# Patient Record
Sex: Male | Born: 1965 | Race: White | Hispanic: No | Marital: Married | Smoking: Never smoker
Health system: Southern US, Community
[De-identification: ages and names within clinical notes are randomized; demographics above are authoritative.]

---

## 2011-03-04 ENCOUNTER — Emergency Department (HOSPITAL_BASED_OUTPATIENT_CLINIC_OR_DEPARTMENT_OTHER)
Admission: EM | Admit: 2011-03-04 | Discharge: 2011-03-04 | Disposition: A | Discharge status: Home Health | Payer: 59 | Attending: Emergency Medicine | Admitting: Emergency Medicine

## 2011-03-04 ENCOUNTER — Encounter: Payer: Self-pay | Admitting: *Deleted

## 2011-03-04 DIAGNOSIS — S335XXA Sprain of ligaments of lumbar spine, initial encounter: Secondary | ICD-10-CM | POA: Insufficient documentation

## 2011-03-04 DIAGNOSIS — X58XXXA Exposure to other specified factors, initial encounter: Secondary | ICD-10-CM | POA: Insufficient documentation

## 2011-03-04 DIAGNOSIS — S39012A Strain of muscle, fascia and tendon of lower back, initial encounter: Secondary | ICD-10-CM

## 2011-03-04 MED ORDER — HYDROMORPHONE HCL 1 MG/ML IJ SOLN
1.0000 mg | Freq: Once | INTRAMUSCULAR | Status: AC
  Administered 2011-03-04: 1 mg via INTRAMUSCULAR
  Filled 2011-03-04: qty 1

## 2011-03-04 MED ORDER — DIAZEPAM 5 MG PO TABS: 5.0000 mg | ORAL_TABLET | Freq: Four times a day (QID) | ORAL | Status: AC | PRN

## 2011-03-04 MED ORDER — DIAZEPAM 5 MG PO TABS
5.0000 mg | ORAL_TABLET | Freq: Once | ORAL | Status: AC
  Administered 2011-03-04: 5 mg via ORAL
  Filled 2011-03-04: qty 1

## 2011-03-04 MED ORDER — KETOROLAC TROMETHAMINE 30 MG/ML IJ SOLN
30.0000 mg | Freq: Once | INTRAMUSCULAR | Status: AC
  Administered 2011-03-04: 30 mg via INTRAMUSCULAR
  Filled 2011-03-04: qty 1

## 2011-03-04 MED ORDER — OXYCODONE-ACETAMINOPHEN 5-325 MG PO TABS: 1.0000 | ORAL_TABLET | ORAL | Status: AC | PRN

## 2011-03-04 MED ORDER — IBUPROFEN 600 MG PO TABS: 600.0000 mg | ORAL_TABLET | Freq: Four times a day (QID) | ORAL | Status: AC | PRN

## 2011-03-04 NOTE — ED Provider Notes (Signed)
History     Chief Complaint  Patient presents with  . Back Pain   Patient is a 45 y.o. male presenting with back pain. The history is provided by the patient.  Back Pain  The current episode started yesterday. The problem occurs constantly. The problem has been gradually worsening. The pain is present in the lumbar spine. The quality of the pain is described as stabbing. The pain does not radiate. The pain is moderate. The symptoms are aggravated by bending and certain positions. Pertinent negatives include no fever, no numbness, no abdominal pain, no bowel incontinence, no perianal numbness, no bladder incontinence, no paresthesias, no paresis and no tingling.  patient bent over yesterday and his lower back started to hurt.   History reviewed. No pertinent past medical history.  History reviewed. No pertinent past surgical history.  History reviewed. No pertinent family history.  History  Substance Use Topics  . Smoking status: Never Smoker   . Smokeless tobacco: Not on file  . Alcohol Use: No      Review of Systems  Constitutional: Negative for fever.  Gastrointestinal: Negative for abdominal pain and bowel incontinence.  Genitourinary: Negative for bladder incontinence, urgency and decreased urine volume.  Musculoskeletal: Positive for back pain. Negative for myalgias, joint swelling and gait problem.  Neurological: Negative for tingling, numbness and paresthesias.    Physical Exam  BP 138/87  Pulse 66  Temp(Src) 98.1 F (36.7 C) (Oral)  Resp 20  Ht 5\' 7"  (1.702 m)  Wt 175 lb (79.379 kg)  BMI 27.41 kg/m2  SpO2 99%  Physical Exam  Nursing note and vitals reviewed. Constitutional: He is oriented to person, place, and time. He appears well-developed and well-nourished.  HENT:  Head: Normocephalic and atraumatic.  Abdominal: Soft. He exhibits no distension and no mass. There is no tenderness. There is no rebound and no guarding.  Musculoskeletal: Normal range of  motion. He exhibits no edema.       Lumbar paraspinal spasm. No spinal tenderness. No rash. Strength and sensation intact in bilateral lower extremities.   Neurological: He is alert and oriented to person, place, and time. No cranial nerve deficit.  Skin: Skin is warm and dry.  Psychiatric: He has a normal mood and affect.    ED Course  Procedures  MDM Back pain after bending over. No red flags. Pain now managable will d/c. Follow up as needed.       Juliet Rude. Rubin Payor, MD 03/04/11 [id number]

## 2011-03-04 NOTE — ED Notes (Signed)
Pt states he bent over on Friday and injured his lower back. Has progressively gotten worse.

## 2019-01-11 ENCOUNTER — Emergency Department (HOSPITAL_BASED_OUTPATIENT_CLINIC_OR_DEPARTMENT_OTHER)
Admission: EM | Admit: 2019-01-11 | Discharge: 2019-01-11 | Disposition: A | Discharge status: Home / Self Care | Payer: 59 | Attending: Emergency Medicine | Admitting: Emergency Medicine

## 2019-01-11 ENCOUNTER — Emergency Department (HOSPITAL_BASED_OUTPATIENT_CLINIC_OR_DEPARTMENT_OTHER): Payer: 59

## 2019-01-11 ENCOUNTER — Other Ambulatory Visit: Payer: Self-pay

## 2019-01-11 ENCOUNTER — Encounter (HOSPITAL_BASED_OUTPATIENT_CLINIC_OR_DEPARTMENT_OTHER): Payer: Self-pay | Admitting: Emergency Medicine

## 2019-01-11 DIAGNOSIS — S0990XA Unspecified injury of head, initial encounter: Secondary | ICD-10-CM | POA: Diagnosis present

## 2019-01-11 DIAGNOSIS — S060X9A Concussion with loss of consciousness of unspecified duration, initial encounter: Secondary | ICD-10-CM | POA: Insufficient documentation

## 2019-01-11 DIAGNOSIS — Y93H2 Activity, gardening and landscaping: Secondary | ICD-10-CM | POA: Insufficient documentation

## 2019-01-11 DIAGNOSIS — Y929 Unspecified place or not applicable: Secondary | ICD-10-CM | POA: Diagnosis not present

## 2019-01-11 DIAGNOSIS — Y990 Civilian activity done for income or pay: Secondary | ICD-10-CM | POA: Diagnosis not present

## 2019-01-11 DIAGNOSIS — W312XXA Contact with powered woodworking and forming machines, initial encounter: Secondary | ICD-10-CM | POA: Diagnosis not present

## 2019-01-11 DIAGNOSIS — S20212A Contusion of left front wall of thorax, initial encounter: Secondary | ICD-10-CM | POA: Diagnosis not present

## 2019-01-11 DIAGNOSIS — S0181XA Laceration without foreign body of other part of head, initial encounter: Secondary | ICD-10-CM | POA: Diagnosis not present

## 2019-01-11 DIAGNOSIS — S20219A Contusion of unspecified front wall of thorax, initial encounter: Secondary | ICD-10-CM

## 2019-01-11 MED ORDER — TETANUS-DIPHTH-ACELL PERTUSSIS 5-2.5-18.5 LF-MCG/0.5 IM SUSP: 0.5000 mL | Freq: Once | INTRAMUSCULAR | Status: DC

## 2019-01-11 MED ORDER — LIDOCAINE-EPINEPHRINE (PF) 1 %-1:200000 IJ SOLN
INTRAMUSCULAR | Status: AC
  Administered 2019-01-11: 10 mL
  Filled 2019-01-11: qty 10

## 2019-01-11 NOTE — ED Notes (Signed)
ED Provider at bedside. 

## 2019-01-11 NOTE — ED Triage Notes (Signed)
Pt was working outside with a Investment banker, corporate. He was hit in the head with something. Reports LOC. He is unsure of what happened. He has a large laceration noted to his forehead which is still bleeding. Pressure bandage applied. Also has bruising noted to L upper chest.

## 2019-01-11 NOTE — Discharge Instructions (Signed)
Apply ice to help with the swelling, take over-the-counter medications as needed for pain, suture removal in 5 days

## 2019-01-11 NOTE — ED Notes (Signed)
Pt has a vaccine injured child and is strongly against vaccinations.

## 2019-01-11 NOTE — ED Provider Notes (Signed)
[facility] EMERGENCY DEPARTMENT Provider Note   CSN: [id number] Arrival date & time: 01/11/19  3244    History   Chief Complaint Chief Complaint  Patient presents with  . Head Injury    HPI Julian Bennett is a 53 y.o. male.     HPI Patient presents to the emergency room for evaluation after a head injury.  Patient recalls working outside.  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  Patient is not exactly sure what happened after that.  Coworkers did not actually witness the event think that there must of been some sort of kicked back because a large metal piece of the wood chipper that weighs maybe 20 or 30 pounds was knocked off of the device.  It must have struck the patient in the head and the chest.  Patient denies any neck pain.  He denies any numbness or weakness.  No shortness of breath.  No abdominal pain.  He did not realize he had a bruise on his chest till he undressed when he was here. History reviewed. No pertinent past medical history.  There are no active problems to display for this patient.   History reviewed. No pertinent surgical history.      Home Medications    Prior to Admission medications   Not on File    Family History No family history on file.  Social History Social History   Tobacco Use  . Smoking status: Never Smoker  . Smokeless tobacco: Never Used  Substance Use Topics  . Alcohol use: No  . Drug use: No     Allergies   Patient has no known allergies.   Review of Systems Review of Systems  All other systems reviewed and are negative.    Physical Exam Updated Vital Signs BP (!) 157/110 (BP Location: Right Arm)   Pulse 96   Temp 97.7 F (36.5 C) (Oral)   Resp 18   SpO2 100%   Physical Exam Vitals signs and nursing note reviewed.  Constitutional:      General: He is not in acute distress.    Appearance: He is well-developed.  HENT:     Head: Normocephalic.     Comments: Wedge-shaped  laceration to the left forehead approximately 4 by 3 cm    Right Ear: External ear normal.     Left Ear: External ear normal.  Eyes:     General: No scleral icterus.       Right eye: No discharge.        Left eye: No discharge.     Conjunctiva/sclera: Conjunctivae normal.  Neck:     Musculoskeletal: Neck supple.     Trachea: No tracheal deviation.  Cardiovascular:     Rate and Rhythm: Normal rate and regular rhythm.  Pulmonary:     Effort: Pulmonary effort is normal. No respiratory distress.     Breath sounds: Normal breath sounds. No stridor. No wheezing or rales.     Comments: Contusion noted on the left anterior chest wall Abdominal:     General: Bowel sounds are normal. There is no distension.     Palpations: Abdomen is soft.     Tenderness: There is no abdominal tenderness. There is no guarding or rebound.  Musculoskeletal:        General: No tenderness.  Skin:    General: Skin is warm and dry.     Findings: No rash.  Neurological:     Mental Status: He is  alert.     Cranial Nerves: No cranial nerve deficit (no facial droop, extraocular movements intact, no slurred speech).     Sensory: No sensory deficit.     Motor: No abnormal muscle tone or seizure activity.     Coordination: Coordination normal.     Comments: Patient is alert and oriented but does not recall the details of the event      ED Treatments / Results  Labs (all labs ordered are listed, but only abnormal results are displayed) Labs Reviewed - No data to display  EKG None  Radiology Dg Ribs Unilateral W/chest Left  Result Date: 01/11/2019 CLINICAL DATA:  Hit in chest with solid object EXAM: LEFT RIBS AND CHEST - 3+ VIEW COMPARISON:  None. FINDINGS: Frontal chest as well as oblique and cone-down rib images obtained. Lungs are clear. Heart size and pulmonary vascularity are normal. No adenopathy. There is no pneumothorax or pleural effusion. No rib fracture. IMPRESSION: No evident rib fracture.  Lungs  clear. Electronically Signed   By: Bretta BangWilliam  Woodruff III M.D.   On: 01/11/2019 11:43   Ct Head Wo Contrast  Result Date: 01/11/2019 CLINICAL DATA:  Hit in head with solid object with transient loss of consciousness. EXAM: CT HEAD WITHOUT CONTRAST TECHNIQUE: Contiguous axial images were obtained from the base of the skull through the vertex without intravenous contrast. COMPARISON:  None. FINDINGS: Brain: The ventricles are normal in size and configuration. There is no intracranial mass, hemorrhage, extra-axial fluid collection, or midline shift. The brain parenchyma appears unremarkable. No acute infarct evident. Vascular: No hyperdense vessel. There is no appreciable vascular calcification. Skull: The bony calvarium appears intact. There is soft tissue injury along the left frontal scalp with soft tissue air and mild soft tissue swelling. Sinuses/Orbits: There is mucosal thickening in several ethmoid air cells. Other visualized paranasal sinuses are clear. Orbits appear symmetric bilaterally. Other: Mastoid air cells are clear. IMPRESSION: Left frontal scalp injury.  No fracture. No intracranial mass or hemorrhage. No extra-axial fluid. Brain parenchyma appears normal. Mucosal thickening in several ethmoid air cells. Electronically Signed   By: Bretta BangWilliam  Woodruff III M.D.   On: 01/11/2019 11:42    Procedures .Marland Kitchen.Laceration Repair Date/Time: 01/11/2019 11:01 AM Performed by: Linwood DibblesKnapp, Stacee Earp, MD Authorized by: Linwood DibblesKnapp, Maalle Starrett, MD   Consent:    Consent obtained:  Verbal   Consent given by:  Patient   Risks discussed:  Infection, need for additional repair, pain, poor cosmetic result and poor wound healing   Alternatives discussed:  No treatment and delayed treatment Universal protocol:    Procedure explained and questions answered to patient or proxy's satisfaction: yes     Relevant documents present and verified: yes     Test results available and properly labeled: yes     Imaging studies available: yes      Required blood products, implants, devices, and special equipment available: yes     Site/side marked: yes     Immediately prior to procedure, a time out was called: yes     Patient identity confirmed:  Verbally with patient Anesthesia (see MAR for exact dosages):    Anesthesia method:  Local infiltration   Local anesthetic:  Lidocaine 1% WITH epi Laceration details:    Location: Left forehead.   Length (cm):  7   Depth (mm):  3 Repair type:    Repair type:  Complex Pre-procedure details:    Preparation:  Patient was prepped and draped in usual sterile fashion Exploration:  Limited defect created (wound extended): yes     Hemostasis achieved with:  Direct pressure   Wound exploration: entire depth of wound probed and visualized     Wound extent: no underlying fracture noted and no vascular damage noted     Contaminated: no   Treatment:    Area cleansed with:  Saline   Amount of cleaning:  Extensive   Irrigation solution:  Sterile saline   Irrigation method:  Pressure wash   Visualized foreign bodies/material removed: no     Debridement:  None   Undermining:  None   Scar revision: no   Fascia repair:    Suture size:  3-0   Suture material:  Vicryl   Suture technique:  Simple interrupted   Number of sutures:  2 Skin repair:    Repair method:  Sutures and tissue adhesive   Suture size:  5-0   Suture material:  Prolene   Suture technique:  Simple interrupted   Number of sutures:  7 Approximation:    Approximation:  Close Post-procedure details:    Dressing:  Open (no dressing) Comments:     Small amount of dermabond placed at the angle of the wound to keep the edges well approximated   (including critical care time)  Medications Ordered in ED Medications  Tdap (BOOSTRIX) injection 0.5 mL (0.5 mLs Intramuscular Not Given 01/11/19 1109)  lidocaine-EPINEPHrine (XYLOCAINE-EPINEPHrine) 1 %-1:200000 (PF) injection (10 mLs  Given 01/11/19 1044)  lidocaine-EPINEPHrine  (XYLOCAINE-EPINEPHrine) 1 %-1:200000 (PF) injection (10 mLs  Given 01/11/19 1044)     Initial Impression / Assessment and Plan / ED Course  I have reviewed the triage vital signs and the nursing notes.  Pertinent labs & imaging results that were available during my care of the patient were reviewed by me and considered in my medical decision making (see chart for details).   Head CT without acute findings.  Chest x-ray without fracture No signs of any serious head injury associated with the patient's injury.  I suspect patient does have a mild concussion.  Patient is stable for discharge.  Discussed use of over-the-counter medications.  Suture removal in 5 days.  Patient's wife was present during the discharge process  Final Clinical Impressions(s) / ED Diagnoses   Final diagnoses:  Injury of head, initial encounter  Concussion with loss of consciousness, initial encounter  Laceration of forehead, initial encounter  Contusion of chest wall, unspecified laterality, initial encounter    ED Discharge Orders    None       Linwood DibblesKnapp, Simeon Vera, MD 01/11/19 1226

## 2019-01-17 ENCOUNTER — Other Ambulatory Visit: Payer: Self-pay

## 2019-01-17 ENCOUNTER — Emergency Department (HOSPITAL_BASED_OUTPATIENT_CLINIC_OR_DEPARTMENT_OTHER)
Admission: EM | Admit: 2019-01-17 | Discharge: 2019-01-17 | Disposition: A | Discharge status: Home / Self Care | Payer: 59 | Attending: Emergency Medicine | Admitting: Emergency Medicine

## 2019-01-17 ENCOUNTER — Encounter (HOSPITAL_BASED_OUTPATIENT_CLINIC_OR_DEPARTMENT_OTHER): Payer: Self-pay | Admitting: *Deleted

## 2019-01-17 DIAGNOSIS — Z4802 Encounter for removal of sutures: Secondary | ICD-10-CM | POA: Diagnosis not present

## 2019-01-17 DIAGNOSIS — X58XXXD Exposure to other specified factors, subsequent encounter: Secondary | ICD-10-CM | POA: Insufficient documentation

## 2019-01-17 DIAGNOSIS — S20212D Contusion of left front wall of thorax, subsequent encounter: Secondary | ICD-10-CM | POA: Insufficient documentation

## 2019-01-17 NOTE — ED Triage Notes (Signed)
Here for suture removal from his forehead. Sutures intact and incision looks well healed. He states his left chest is badly bruised with a knot felt inside the area of injury.

## 2019-01-17 NOTE — ED Provider Notes (Signed)
[facility] Provider Note   CSN: [id number] Arrival date & time: 01/17/19  1505    History   Chief Complaint Chief Complaint  Patient presents with  . Suture / Staple Removal    HPI Lula Michaux is a 53 y.o. male.     The history is provided by the patient and medical records. No language interpreter was used.  Suture / Staple Removal Pertinent negatives include no chest pain and no shortness of breath.   Joanathan Affeldt is a 53 y.o. male who presents to the Emergency Department for suture removal from the forehead.  Feels as if they are healing well.  No redness or drainage from his wounds.   Additionally reports swelling and bruising to left chest wall.  It is not painful, but he would like this to be looked at as well while he is in the emergency department today.  He is not on any anticoagulation.  He is not having any shortness of breath.  History reviewed. No pertinent past medical history.  There are no active problems to display for this patient.   History reviewed. No pertinent surgical history.      Home Medications    Prior to Admission medications   Not on File    Family History No family history on file.  Social History Social History   Tobacco Use  . Smoking status: Never Smoker  . Smokeless tobacco: Never Used  Substance Use Topics  . Alcohol use: No  . Drug use: No     Allergies   Patient has no known allergies.   Review of Systems Review of Systems  Constitutional: Negative for fever.  Respiratory: Negative for shortness of breath.   Cardiovascular: Negative for chest pain.  Musculoskeletal: Negative for arthralgias and myalgias.  Skin: Positive for wound.     Physical Exam Updated Vital Signs BP (!) 163/103   Pulse 74   Temp 98.3 F (36.8 C) (Oral)   Resp 20   Ht 5\' 7"  (1.702 m)   Wt 79.4 kg   SpO2 99%   BMI 27.42 kg/m   Physical Exam Vitals signs and nursing note reviewed.   Constitutional:      General: He is not in acute distress.    Appearance: He is well-developed.  HENT:     Head: Normocephalic.     Comments: Well-healing laceration to the forehead which is c/d/i with sutures in place. Neck:     Musculoskeletal: Neck supple.  Cardiovascular:     Rate and Rhythm: Normal rate and regular rhythm.     Heart sounds: Normal heart sounds. No murmur.  Pulmonary:     Effort: Pulmonary effort is normal. No respiratory distress.     Breath sounds: Normal breath sounds. No wheezing or rales.     Comments: Lungs clear to auscultation bilaterally. Musculoskeletal: Normal range of motion.     Comments: Ecchymosis to the left upper chest wall.  Nontender.  One 2 x 2 centimeter area of swelling within the ecchymosis which is also nontender.  Likely hematoma.  Skin:    General: Skin is warm and dry.  Neurological:     Mental Status: He is alert.      ED Treatments / Results  Labs (all labs ordered are listed, but only abnormal results are displayed) Labs Reviewed - No data to display  EKG None  Radiology No results found.  Procedures .Suture Removal  Date/Time: 01/17/2019 3:16 PM Performed by: Marquise Wicke, York Cerise  Pilcher, PA-C Authorized by: Mana Haberl, Chase PicketJaime Pilcher, PA-C   Consent:    Consent obtained:  Verbal   Consent given by:  Patient   Risks discussed:  Bleeding, pain and wound separation Procedure details:    Wound appearance:  No signs of infection and clean   Number of sutures removed:  7 Post-procedure details:    Patient tolerance of procedure:  Tolerated well, no immediate complications   (including critical care time)  Medications Ordered in ED Medications - No data to display   Initial Impression / Assessment and Plan / ED Course  I have reviewed the triage vital signs and the nursing notes.  Pertinent labs & imaging results that were available during my care of the patient were reviewed by me and considered in my medical decision making  (see chart for details).       Derrick Ravelllen Pautler is a 53 y.o. male who presents to ED for 2 complaints:  1.  Suture removal.  Wound appears clean without any signs of infection.  Sutures removed without complication.  Continued wound care instructions discussed.   2.  Ecchymosis to chest wall.  Per chart review, contusion was noted on initial evaluation after injury.  Patient reports change in coloration of bruising as well as " knot" to the area.  On my exam, area is nontender.  He is not having any difficulty breathing and has clear lung sounds.  Nodule of concern likely represents hematoma.  Warm compresses and home care instructions discussed.  Likely will resolve.  Reasons to return to the emergency department were discussed.  PCP follow-up if no improvement in a few days.  All questions answered.   Final Clinical Impressions(s) / ED Diagnoses   Final diagnoses:  Visit for suture removal    ED Discharge Orders    None       Contrina Orona, Chase PicketJaime Pilcher, PA-C 01/17/19 1707    Jacalyn LefevreHaviland, Julie, MD 01/17/19 1718

## 2019-01-17 NOTE — Discharge Instructions (Addendum)
Warm compresses to the affected area on chest.   Keep wound clean and dry.   Follow up with your primary care doctor.   Return to ER for new or worsening symptoms, any additional concerns.

## 2019-01-17 NOTE — ED Notes (Signed)
Pt. Had bruising in the L chest area / purple and yellow in color with some hardness noted under the bruising.  Pt. Reports just soreness.  Pt. Has no shortness of breath and no limited mobility to the L arm or L shoulder.  Pt. In no distress.

## 2019-12-09 IMAGING — CR LEFT RIBS AND CHEST - 3+ VIEW
4 series · 4 of 4 positions shown · non-contrast
Comparison: None.

CLINICAL DATA: Hit in chest with solid object

EXAM:
LEFT RIBS AND CHEST - 3+ VIEW

[w chest pa]
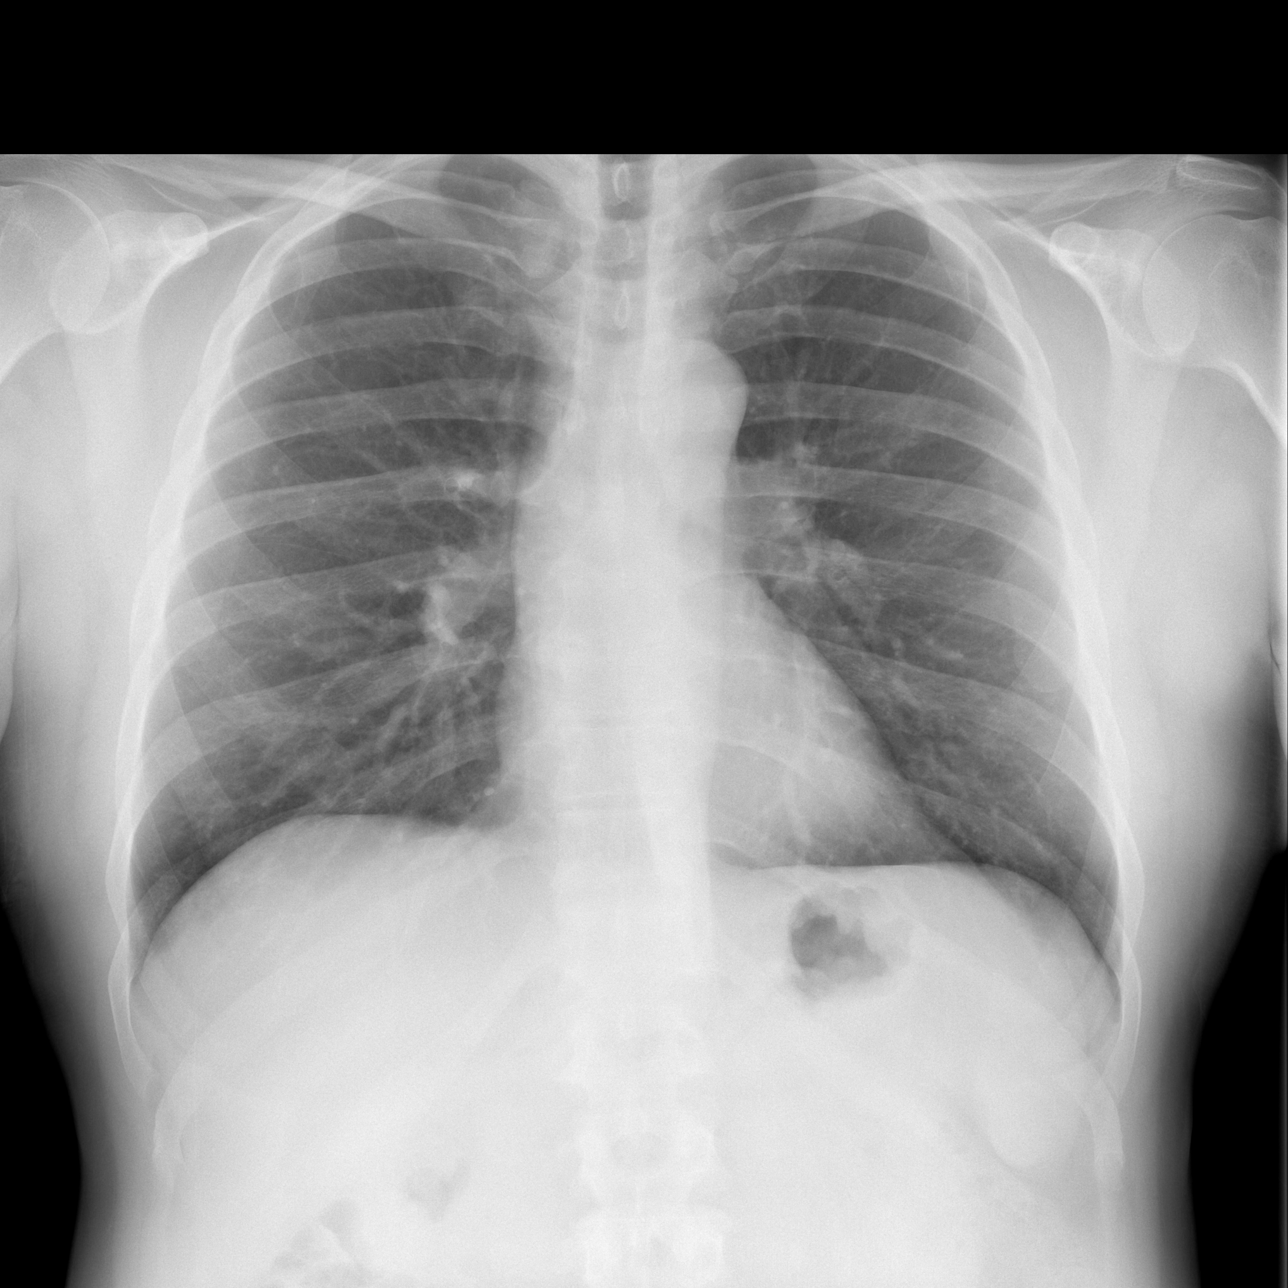

[w ribs ap/pa upper left]
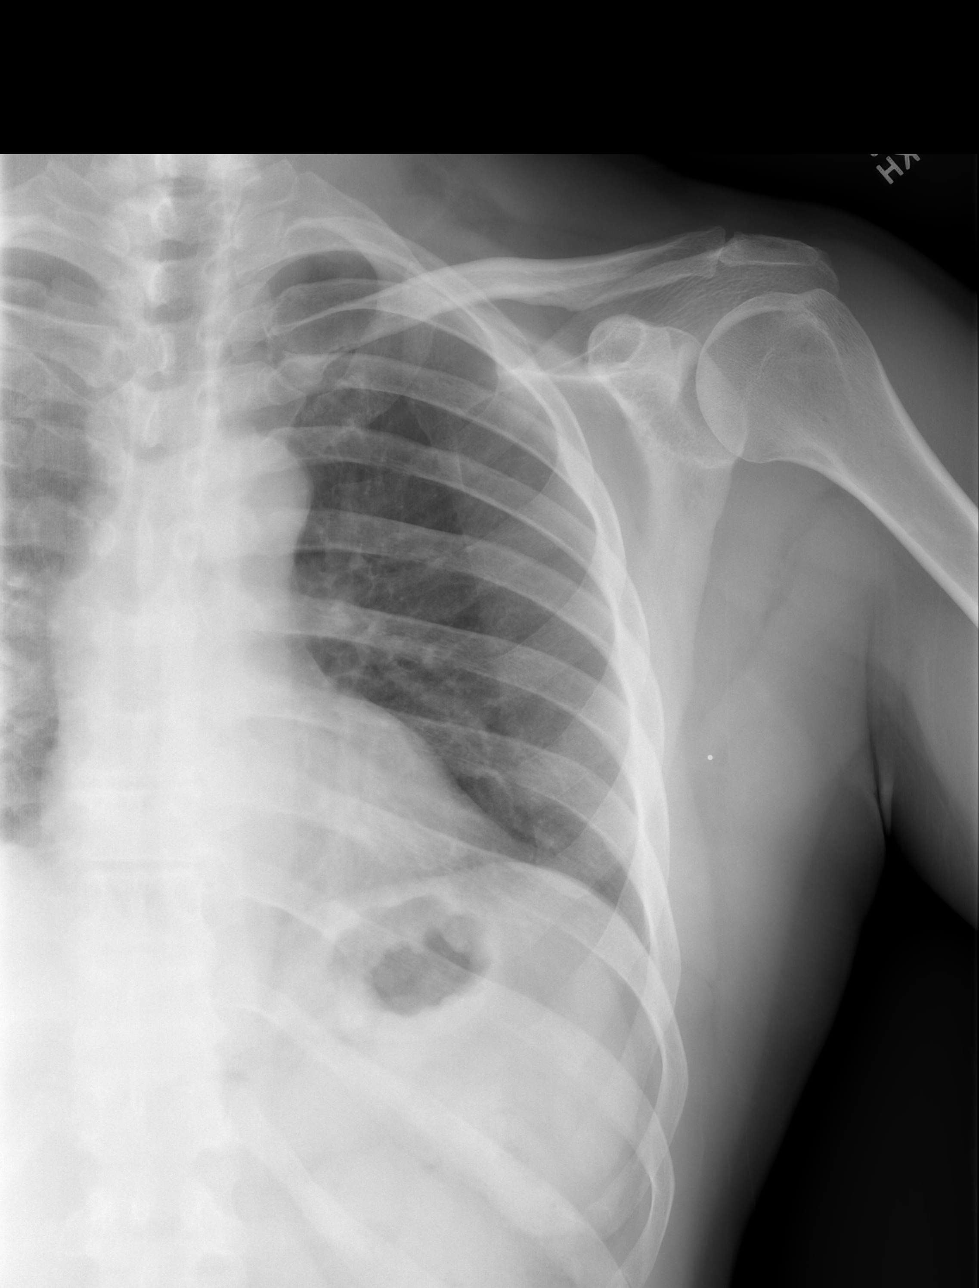

[w ribs ap/pa lower left]
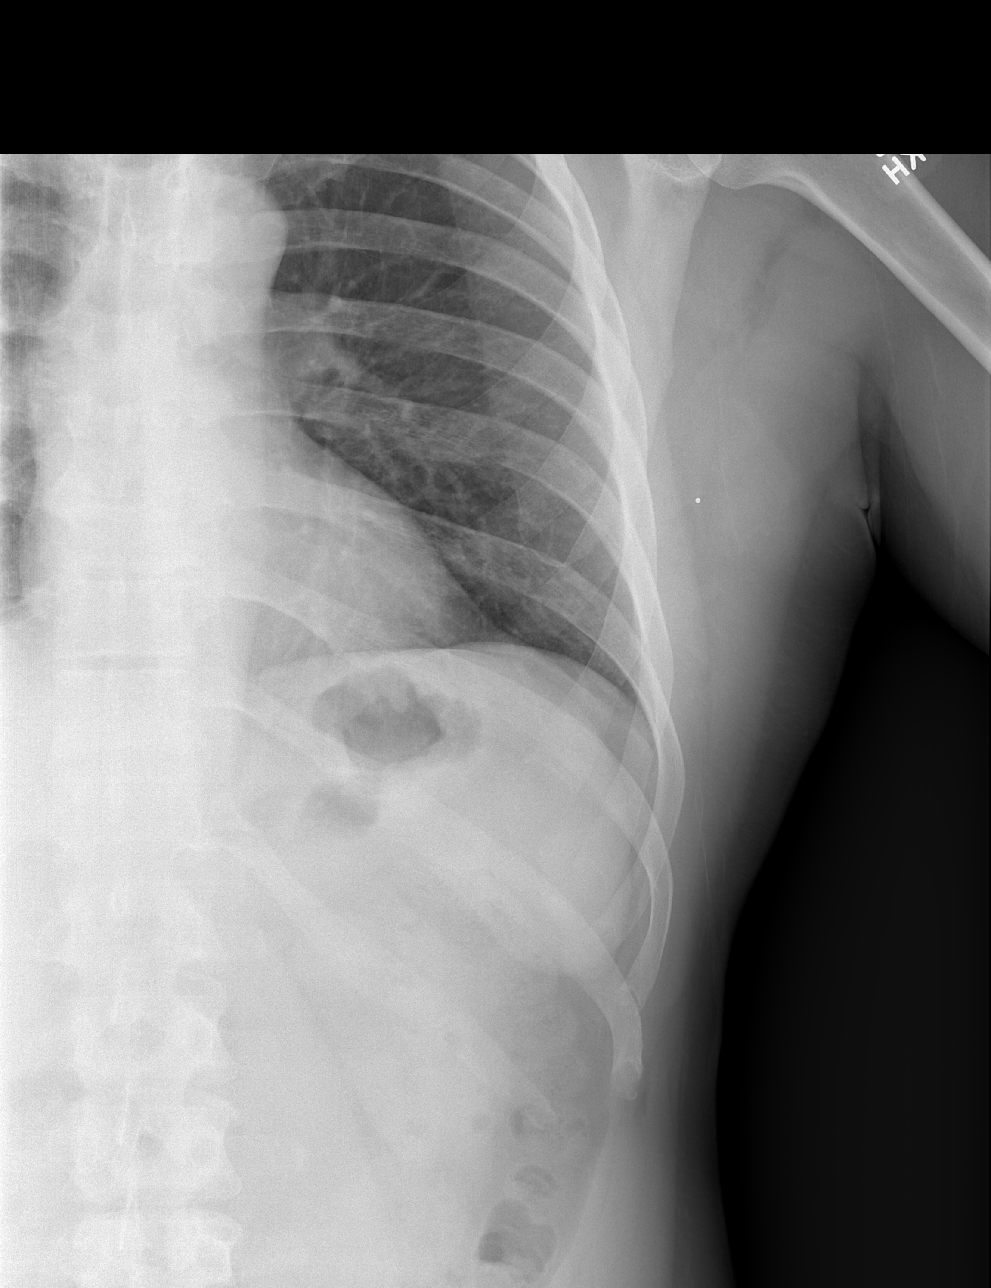

[w ribs oblique left]
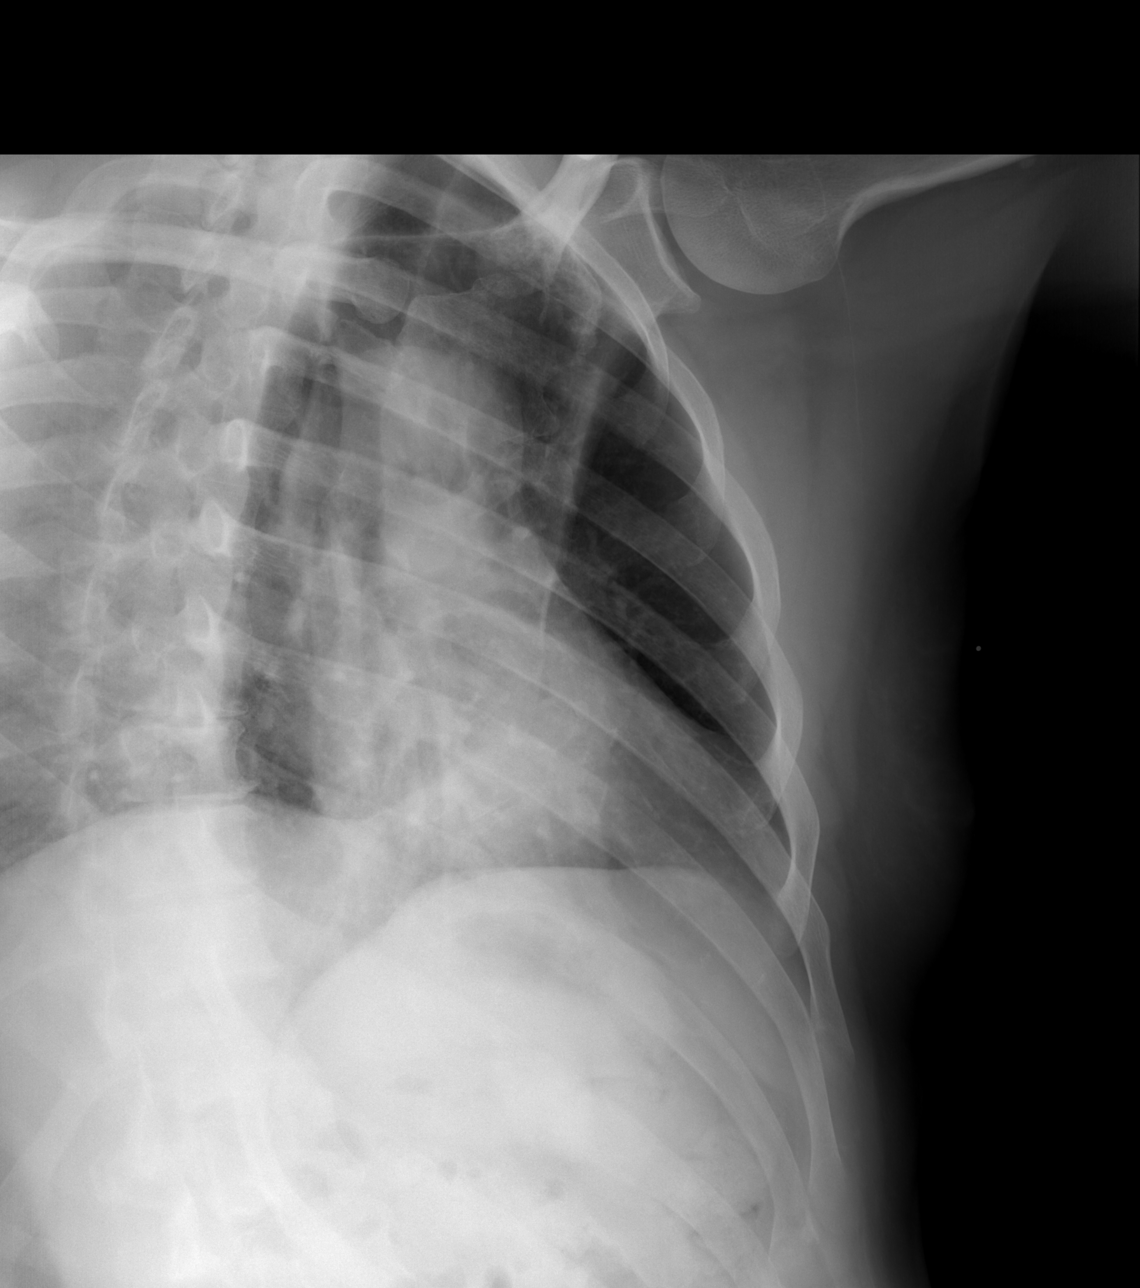

[4 of 4 positions shown; findings below may reference images not displayed]

FINDINGS: Frontal chest as well as oblique and cone-down rib images obtained.
Lungs are clear. Heart size and pulmonary vascularity are normal. No
adenopathy. There is no pneumothorax or pleural effusion. No rib
fracture.
IMPRESSION: No evident rib fracture.  Lungs clear.
